# Patient Record
Sex: Female | Born: 1980 | Hispanic: Yes | Marital: Married
Health system: Southern US, Community
[De-identification: ages and names within clinical notes are randomized; demographics above are authoritative.]

## PROBLEM LIST (undated history)

## (undated) DIAGNOSIS — E119 Type 2 diabetes mellitus without complications: Secondary | ICD-10-CM

## (undated) HISTORY — PX: APPENDECTOMY: SHX54

---

## 2004-08-22 ENCOUNTER — Inpatient Hospital Stay: Payer: Self-pay | Admitting: Internal Medicine

## 2004-10-06 ENCOUNTER — Emergency Department: Payer: Self-pay | Admitting: General Practice

## 2006-03-20 ENCOUNTER — Emergency Department: Payer: Self-pay | Admitting: Unknown Physician Specialty

## 2006-11-07 ENCOUNTER — Emergency Department: Payer: Self-pay

## 2006-11-08 ENCOUNTER — Ambulatory Visit: Payer: Self-pay

## 2007-03-21 ENCOUNTER — Emergency Department: Payer: Self-pay | Admitting: Emergency Medicine

## 2008-06-20 ENCOUNTER — Emergency Department: Payer: Self-pay | Admitting: Emergency Medicine

## 2008-08-12 ENCOUNTER — Emergency Department: Payer: Self-pay | Admitting: Emergency Medicine

## 2008-08-13 ENCOUNTER — Ambulatory Visit: Payer: Self-pay | Admitting: Obstetrics & Gynecology

## 2010-12-24 ENCOUNTER — Emergency Department: Payer: Self-pay | Admitting: Emergency Medicine

## 2013-03-18 ENCOUNTER — Emergency Department: Payer: Self-pay | Admitting: Emergency Medicine

## 2013-03-18 LAB — URINALYSIS, COMPLETE
Bacteria: NONE SEEN
Ketone: NEGATIVE
Nitrite: NEGATIVE
Ph: 7 (ref 4.5–8.0)
Protein: 100
RBC,UR: 24 /HPF (ref 0–5)
WBC UR: 191 /HPF (ref 0–5)

## 2016-01-24 ENCOUNTER — Emergency Department
Admission: EM | Admit: 2016-01-24 | Discharge: 2016-01-24 | Disposition: A | Discharge status: Home / Self Care | Payer: Self-pay | Attending: Emergency Medicine | Admitting: Emergency Medicine

## 2016-01-24 ENCOUNTER — Encounter: Payer: Self-pay | Admitting: Medical Oncology

## 2016-01-24 ENCOUNTER — Emergency Department: Payer: Self-pay

## 2016-01-24 DIAGNOSIS — R1031 Right lower quadrant pain: Secondary | ICD-10-CM | POA: Insufficient documentation

## 2016-01-24 DIAGNOSIS — E1165 Type 2 diabetes mellitus with hyperglycemia: Secondary | ICD-10-CM | POA: Insufficient documentation

## 2016-01-24 DIAGNOSIS — R739 Hyperglycemia, unspecified: Secondary | ICD-10-CM

## 2016-01-24 DIAGNOSIS — R109 Unspecified abdominal pain: Secondary | ICD-10-CM

## 2016-01-24 HISTORY — DX: Type 2 diabetes mellitus without complications: E11.9

## 2016-01-24 LAB — CBC
HCT: 46.3 % (ref 35.0–47.0)
HEMOGLOBIN: 15.2 g/dL (ref 12.0–16.0)
MCH: 25.8 pg — AB (ref 26.0–34.0)
MCHC: 32.9 g/dL (ref 32.0–36.0)
MCV: 78.5 fL — AB (ref 80.0–100.0)
Platelets: 275 10*3/uL (ref 150–440)
RBC: 5.9 MIL/uL — AB (ref 3.80–5.20)
RDW: 12.8 % (ref 11.5–14.5)
WBC: 9.7 10*3/uL (ref 3.6–11.0)

## 2016-01-24 LAB — GLUCOSE, CAPILLARY
GLUCOSE-CAPILLARY: 373 mg/dL — AB (ref 65–99)
GLUCOSE-CAPILLARY: 285 mg/dL — AB (ref 65–99)

## 2016-01-24 LAB — COMPREHENSIVE METABOLIC PANEL
ALBUMIN: 4.2 g/dL (ref 3.5–5.0)
ALK PHOS: 82 U/L (ref 38–126)
ALT: 22 U/L (ref 14–54)
ANION GAP: 9 (ref 5–15)
AST: 26 U/L (ref 15–41)
BUN: 9 mg/dL (ref 6–20)
CALCIUM: 9.3 mg/dL (ref 8.9–10.3)
CHLORIDE: 101 mmol/L (ref 101–111)
CO2: 22 mmol/L (ref 22–32)
CREATININE: 0.39 mg/dL — AB (ref 0.44–1.00)
GFR calc non Af Amer: >60 mL/min (ref >60)
GLUCOSE: 361 mg/dL — AB (ref 65–99)
Potassium: 3.9 mmol/L (ref 3.5–5.1)
SODIUM: 132 mmol/L — AB (ref 135–145)
Total Bilirubin: 0.7 mg/dL (ref 0.3–1.2)
Total Protein: 7.8 g/dL (ref 6.5–8.1)

## 2016-01-24 LAB — URINALYSIS COMPLETE WITH MICROSCOPIC (ARMC ONLY)
Bacteria, UA: NONE SEEN
Bilirubin Urine: NEGATIVE
Leukocytes, UA: NEGATIVE
Nitrite: NEGATIVE
PROTEIN: NEGATIVE mg/dL
Specific Gravity, Urine: 1.028 (ref 1.005–1.030)
pH: 6 (ref 5.0–8.0)

## 2016-01-24 LAB — LIPASE, BLOOD: LIPASE: 34 U/L (ref 11–51)

## 2016-01-24 MED ORDER — INSULIN ASPART 100 UNIT/ML ~~LOC~~ SOLN
6.0000 [IU] | Freq: Once | SUBCUTANEOUS | Status: DC
  Filled 2016-01-24: qty 0.06
  Filled 2016-01-24: qty 6

## 2016-01-24 MED ORDER — SODIUM CHLORIDE 0.9 % IV BOLUS (SEPSIS)
1000.0000 mL | Freq: Once | INTRAVENOUS | Status: DC
Start: 2016-01-24 — End: 2016-01-24

## 2016-01-24 MED ORDER — INSULIN ASPART 100 UNIT/ML IV SOLN
6.0000 [IU] | Freq: Once | INTRAVENOUS | Status: AC
  Administered 2016-01-24: 6 [IU] via INTRAVENOUS
  Filled 2016-01-24: qty 0.06

## 2016-01-24 MED ORDER — SODIUM CHLORIDE 0.9 % IV BOLUS (SEPSIS)
1000.0000 mL | Freq: Once | INTRAVENOUS | Status: AC
  Administered 2016-01-24: 1000 mL via INTRAVENOUS

## 2016-01-24 MED ORDER — IBUPROFEN 400 MG PO TABS: 400.0000 mg | ORAL_TABLET | Freq: Four times a day (QID) | ORAL | 0 refills | Status: AC | PRN

## 2016-01-24 MED ORDER — METFORMIN HCL 500 MG PO TABS: 500.0000 mg | ORAL_TABLET | Freq: Two times a day (BID) | ORAL | 0 refills | Status: AC

## 2016-01-24 MED ORDER — INSULIN ASPART 100 UNIT/ML ~~LOC~~ SOLN: 6.0000 [IU] | Freq: Once | SUBCUTANEOUS | Status: DC

## 2016-01-24 MED ORDER — KETOROLAC TROMETHAMINE 30 MG/ML IJ SOLN
15.0000 mg | Freq: Once | INTRAMUSCULAR | Status: AC
  Administered 2016-01-24: 15 mg via INTRAVENOUS
  Filled 2016-01-24: qty 1

## 2016-01-24 NOTE — ED Triage Notes (Signed)
Pt reports she began having rt lower abd pain and pain into her flank Friday. Pt also reports that she has been having vaginal itching and pain with urination also. Pt reports she also has not been taking her insulin x 1 week or checking her blood sugars.

## 2016-01-24 NOTE — Discharge Instructions (Signed)
Please return immediately if condition worsens. Please contact her primary physician or the physician you were given for referral. If you have any specialist physicians involved in her treatment and plan please also contact them. Thank you for using [facility].  Please continue with your follow-up appointment with the health department

## 2016-01-24 NOTE — ED Notes (Signed)
MD and interpreter at bedside.

## 2016-01-24 NOTE — ED Notes (Signed)
Interpreter requested for Assessment of patient.

## 2016-01-24 NOTE — ED Provider Notes (Signed)
Time Seen: Approximately 1531  I have reviewed the triage notes  Chief Complaint: Abdominal Pain; Dysuria; Vaginal Itching; and Flank Pain   History of Present Illness: Tricia Gordon is a 35 y.o. female whose history, review of systems, etc. was taken through a Engineer, structural. She apparently is been having some right lower quadrant abdominal pain that radiates toward her right flank area for the last 4 days. Pain seems to come and go and is caused some burning with urination. She's noticed some urinary frequency and states that she's been off her medication now for the past week for her diabetes. Patient states that she's on insulin but can't identify the dosage or what type of insulin she is taking. We tried to investigate through the pharmacy technician and apparently she hasn't had any insulin in several years. The patient points mainly to the right lower quadrant source of discomfort without loose stool, melena, or hematochezia.   Past Medical History:  Diagnosis Date  . Diabetes mellitus without complication (HCC)     There are no active problems to display for this patient.   Past Surgical History:  Procedure Laterality Date  . APPENDECTOMY      Past Surgical History:  Procedure Laterality Date  . APPENDECTOMY      Current Outpatient Rx  . Order #: [id number] Class: Print  . Order #: [id number] Class: Print    Allergies:  Penicillins  Family History: No family history on file.  Social History: Social History  Substance Use Topics  . Smoking status: Not on file  . Smokeless tobacco: Not on file  . Alcohol use Not on file     Review of Systems:   10 point review of systems was performed and was otherwise negative:  Constitutional: No fever Eyes: No visual disturbances ENT: No sore throat, ear pain Cardiac: No chest pain Respiratory: No shortness of breath, wheezing, or stridor Abdomen: Pain is exclusively in the right lower  quadrant. Endocrine: No weight loss, No night sweats Extremities: No peripheral edema, cyanosis Skin: No rashes, easy bruising. She has asked me to look at the lesion is located on her right shoulder. Neurologic: No focal weakness, trouble with speech or swollowing Urologic: No hematuria Physical Exam: She denies any vaginal discharge or bleeding   ED Triage Vitals  Enc Vitals Group     BP 01/24/16 1445 122/88     Pulse Rate 01/24/16 1445 86     Resp 01/24/16 1445 18     Temp 01/24/16 1445 98.7 F (37.1 C)     Temp Source 01/24/16 1445 Oral     SpO2 01/24/16 1445 97 %     Weight 01/24/16 1446 153 lb (69.4 kg)     Height 01/24/16 1446 5\' 2"  (1.575 m)     Head Circumference --      Peak Flow --      Pain Score 01/24/16 1446 8     Pain Loc --      Pain Edu? --      Excl. in [place]? --     General: Awake , Alert , and Oriented times 3; GCS 15 Head: Normal cephalic , atraumatic Eyes: Pupils equal , round, reactive to light Nose/Throat: No nasal drainage, patent upper airway without erythema or exudate.  Neck: Supple, Full range of motion, No anterior adenopathy or palpable thyroid masses Lungs: Clear to ascultation without wheezes , rhonchi, or rales Heart: Regular rate, regular rhythm without murmurs , gallops , or rubs Abdomen:  Soft, non tender without rebound, guarding , or rigidity; bowel sounds positive and symmetric in all 4 quadrants. No organomegaly .        Extremities: 2 plus symmetric pulses. No edema, clubbing or cyanosis Neurologic: normal ambulation, Motor symmetric without deficits, sensory intact Skin: There is a small approximately centimeter circular raised dark-appearing lesion on the right scapular region. There is no skin induration or erythema. No discharge or drainage. The surface does not appear to be irregular in nature.   Labs:   All laboratory work was reviewed including any pertinent negatives or positives listed below:  Labs Reviewed  COMPREHENSIVE  METABOLIC PANEL - Abnormal; Notable for the following:       Result Value   Sodium 132 (*)    Glucose, Bld 361 (*)    Creatinine, Ser 0.39 (*)    All other components within normal limits  CBC - Abnormal; Notable for the following:    RBC 5.90 (*)    MCV 78.5 (*)    MCH 25.8 (*)    All other components within normal limits  URINALYSIS COMPLETEWITH MICROSCOPIC ([facility] ONLY) - Abnormal; Notable for the following:    Color, Urine STRAW (*)    APPearance CLEAR (*)    Glucose, UA >500 (*)    Ketones, ur TRACE (*)    Hgb urine dipstick 2+ (*)    Squamous Epithelial / LPF 0-5 (*)    All other components within normal limits  GLUCOSE, CAPILLARY - Abnormal; Notable for the following:    Glucose-Capillary 373 (*)    All other components within normal limits  GLUCOSE, CAPILLARY - Abnormal; Notable for the following:    Glucose-Capillary 285 (*)    All other components within normal limits  LIPASE, BLOOD  POC URINE PREG, ED   Patient's blood sugar has decreased and she was given IV fluids along with 6 units of IV insulin. Radiology:  "Ct Renal Stone Study  Result Date: 01/24/2016 CLINICAL DATA:  Right flank and lower abdominal pain for past 3 days. Dysuria. EXAM: CT ABDOMEN AND PELVIS WITHOUT CONTRAST TECHNIQUE: Multidetector CT imaging of the abdomen and pelvis was performed following the standard protocol without IV contrast. COMPARISON:  [facility] Oct 19, 2006 FINDINGS: Lower chest:  No acute findings. Hepatobiliary: No mass visualized on this un-enhanced exam. Diffuse hepatic steatosis again noted. Gallbladder is unremarkable. Pancreas: No mass or inflammatory process identified on this un-enhanced exam. Spleen: Within normal limits in size. Adrenals/Urinary Tract: 1.6 cm low-attenuation left adrenal mass is seen measuring 2 Hounsfield units, consistent with a benign adrenal adenoma. Normal right adrenal gland. No evidence of renal calculi or hydronephrosis. No evidence of ureteral calculi or  dilatation. Unopacified urinary bladder is unremarkable in appearance. Stomach/Bowel: No evidence of obstruction, inflammatory process, or abnormal fluid collections. Right lower quadrant surgical staples from prior appendectomy. Vascular/Lymphatic: No pathologically enlarged lymph nodes. No evidence of abdominal aortic aneurysm. Reproductive: No mass or other significant abnormality. Other: None. Musculoskeletal:  No suspicious bone lesions identified. IMPRESSION: No evidence of urolithiasis, hydronephrosis, or other acute findings. Small benign left adrenal adenoma. Diffuse hepatic steatosis. Electronically Signed   By: Myles RosenthalJohn  Stahl M.D.   On: 01/24/2016 16:57  "     I personally reviewed the radiologic studies     ED Course:  Patient's stay here was uneventful and the patient was given IV fluids and was started on some IV insulin. Patient does not appear to be in diabetic ketoacidosis. Patient denies any left-sided abdominal  pain does not appear to have any surgical causes. Her differential includes renal colic, urinary tract infection, ovarian cyst, etc. She does not appear to have any obvious source for her discomfort at this time. Located on her upper back regions small dark in appearance and does not appear to be skin cancer however I advised her to follow-up with a dermatologist for further close examination  Clinical Course     Assessment: * Hyperglycemia Noncompliance with medication Right lower quadrant abdominal pain unspecified     Plan: * Outpatient " Discharge Medication List as of 01/24/2016  8:08 PM    START taking these medications   Details  ibuprofen (ADVIL,MOTRIN) 400 MG tablet Take 1 tablet (400 mg total) by mouth every 6 (six) hours as needed., Starting Mon 01/24/2016, Print    metFORMIN (GLUCOPHAGE) 500 MG tablet Take 1 tablet (500 mg total) by mouth 2 (two) times daily with a meal., Starting Mon 01/24/2016, Until Tue 01/23/2017, Print      " Patient was  advised to return immediately if condition worsens. Patient was advised to follow up with their primary care physician or other specialized physicians involved in their outpatient care. The patient and/or family member/power of attorney had laboratory results reviewed at the bedside. All questions and concerns were addressed and appropriate discharge instructions were distributed by the nursing staff.             Jennye MoccasinBrian S Signe Tackitt, MD 01/24/16 2017

## 2016-01-24 NOTE — ED Notes (Signed)
POC urine preg NEGATIVE.  MD made aware 

## 2017-12-09 IMAGING — CT CT RENAL STONE PROTOCOL
2 of 4 series · 16 of 46 positions shown, 18 images · non-contrast
Comparison: [facility] October 19, 2006

CLINICAL DATA: Right flank and lower abdominal pain for past 3
days. Dysuria.

EXAM:
CT ABDOMEN AND PELVIS WITHOUT CONTRAST
TECHNIQUE: Multidetector CT imaging of the abdomen and pelvis was performed
following the standard protocol without IV contrast.

[Series 2: axial st · axial · 0.66mm/px · z∈[-316,+174]mm · 13 of 108 slices shown, 15 images]
[im 5/108  soft-tissue]
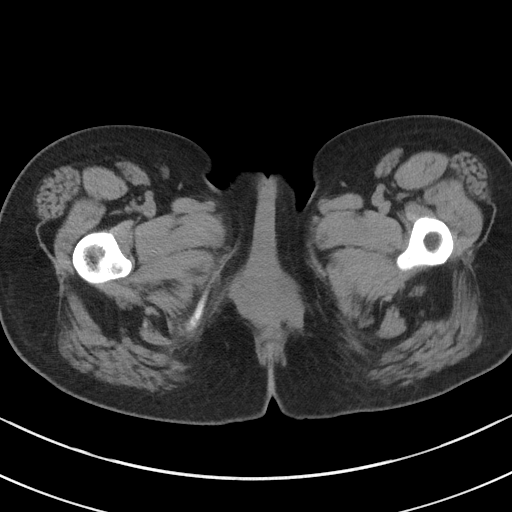
[im 5/108  bone]
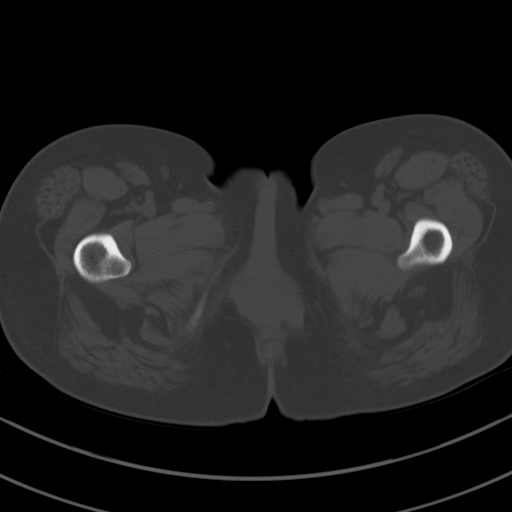
[im 13/108  soft-tissue]
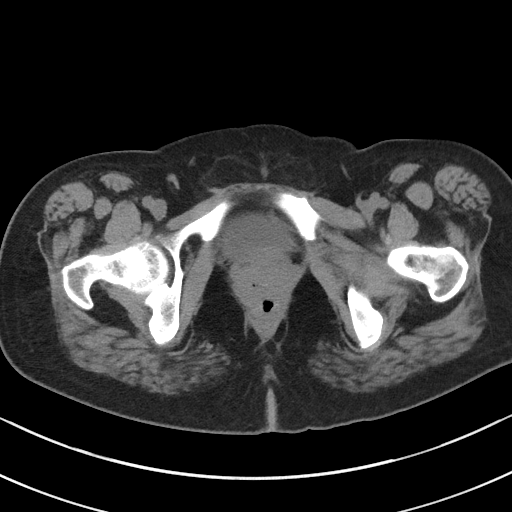
[im 21/108  soft-tissue]
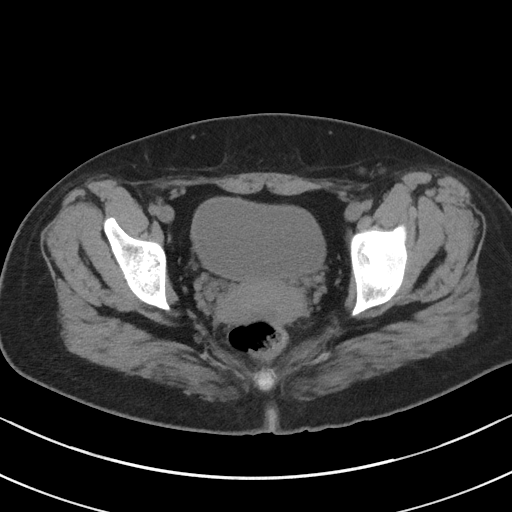
[im 29/108  soft-tissue]
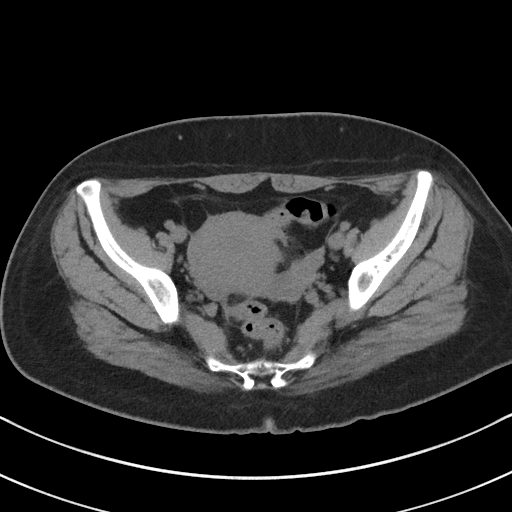
[im 38/108  soft-tissue]
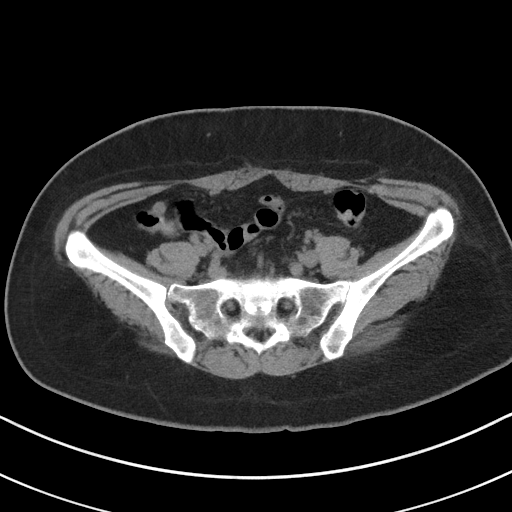
[im 46/108  soft-tissue]
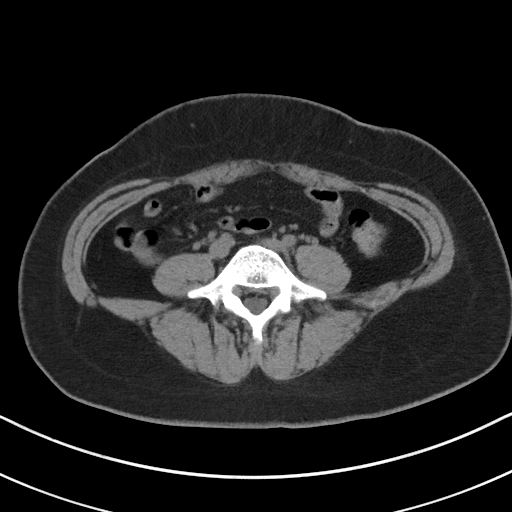
[im 54/108  soft-tissue]
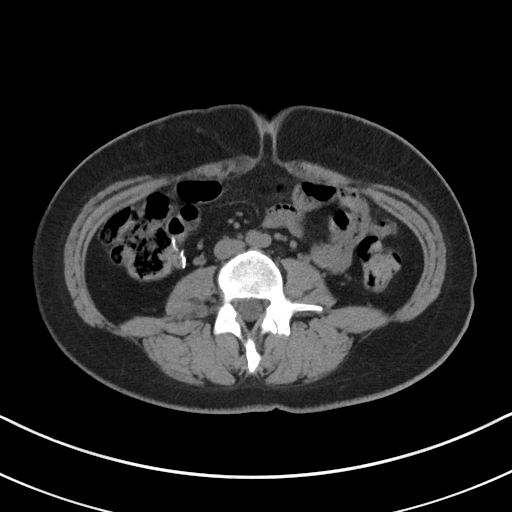
[im 62/108  soft-tissue]
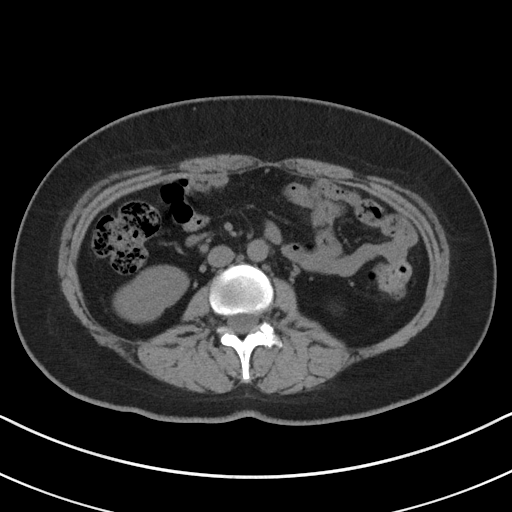
[im 70/108  soft-tissue]
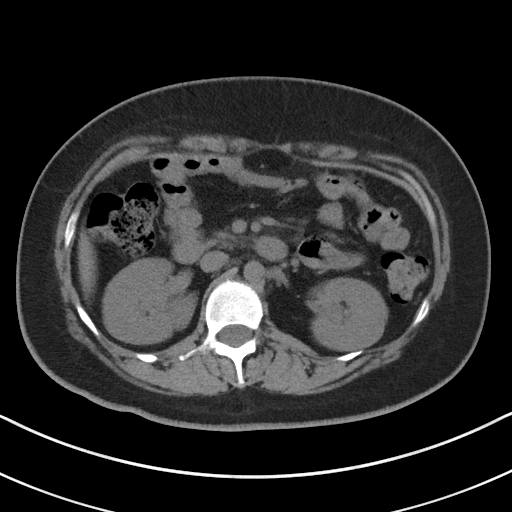
[im 70/108  bone]
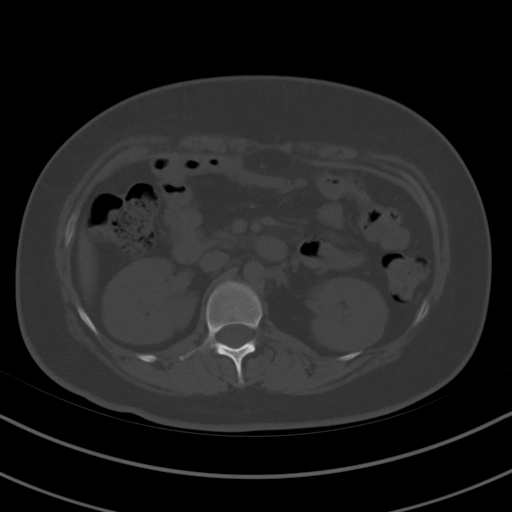
[im 79/108  soft-tissue]
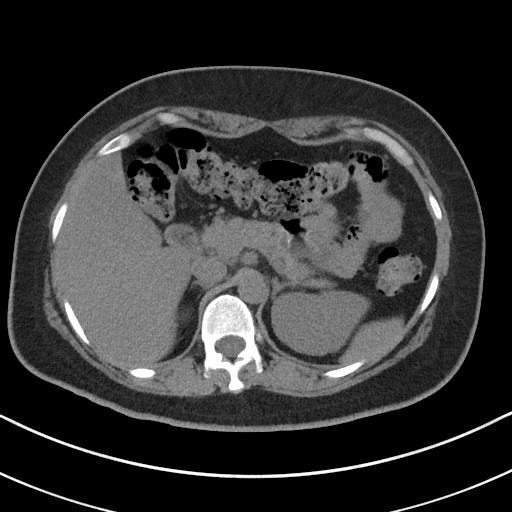
[im 87/108  soft-tissue]
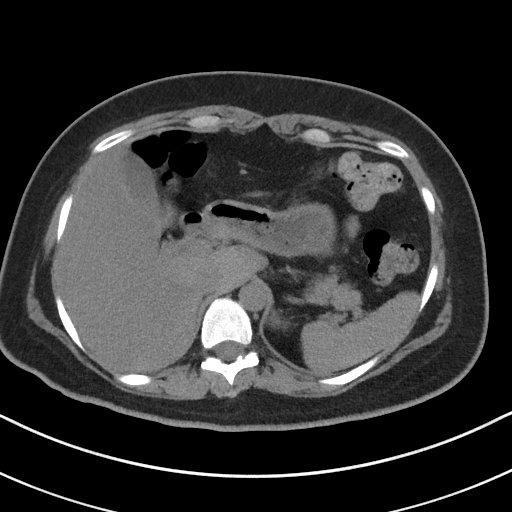
[im 95/108  soft-tissue]
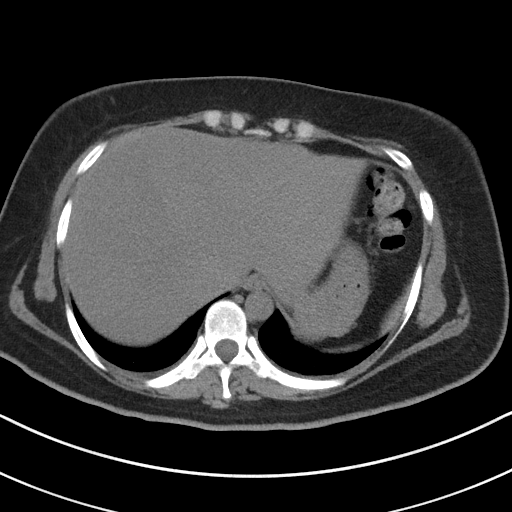
[im 103/108  soft-tissue]
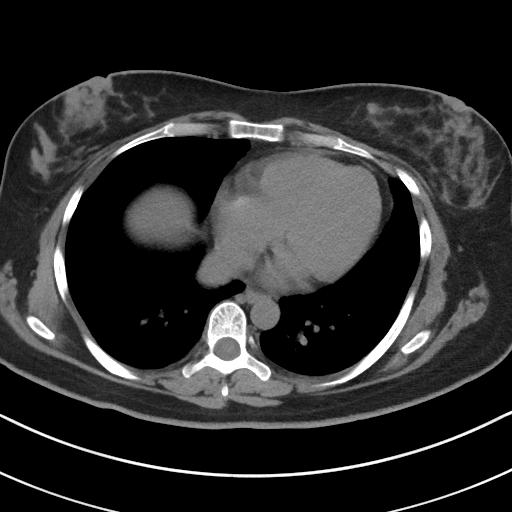

[Series 4: coronal st · coronal · 0.75mm/px · 3 of 78 slices shown]
[im 26/78  soft-tissue]
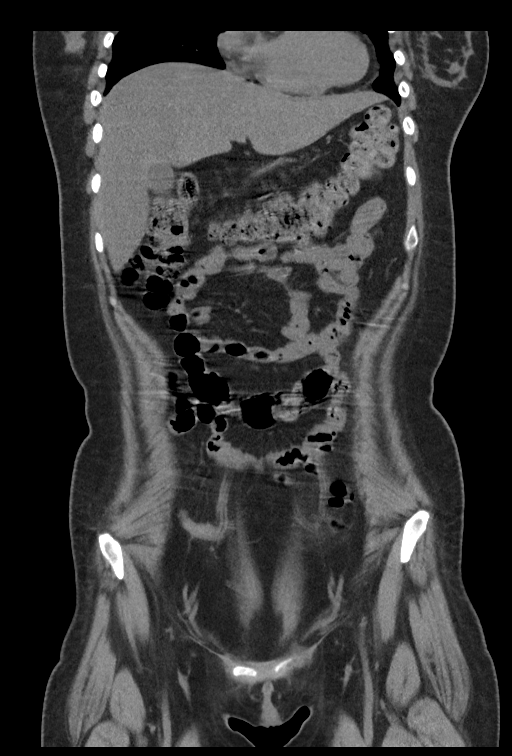
[im 35/78  soft-tissue]
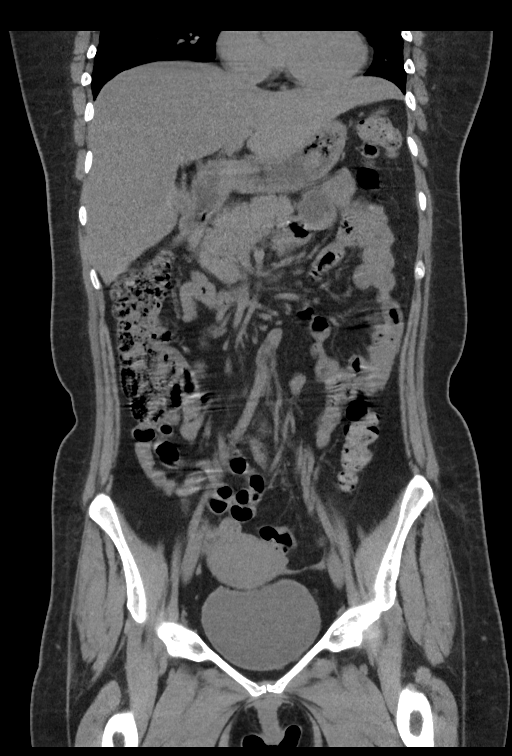
[im 43/78  soft-tissue]
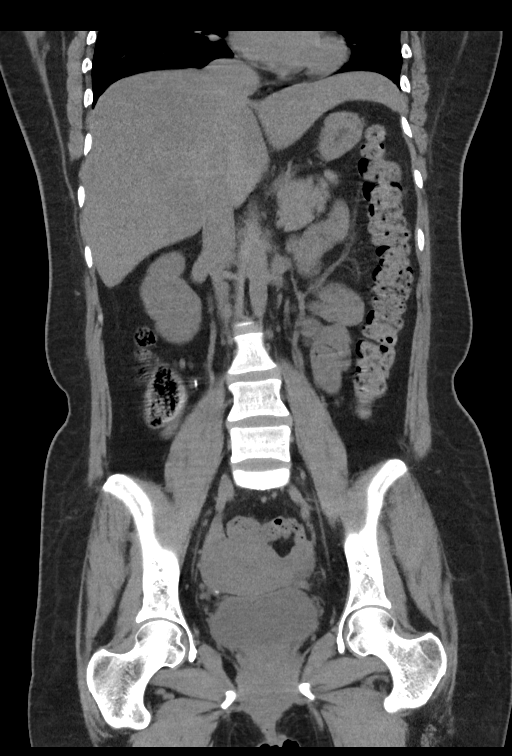

[16 of 46 positions shown; findings below may reference images not displayed]

FINDINGS: Lower chest:  No acute findings.

Hepatobiliary: No mass visualized on this un-enhanced exam. Diffuse
hepatic steatosis again noted. Gallbladder is unremarkable.

Pancreas: No mass or inflammatory process identified on this
un-enhanced exam.

Spleen: Within normal limits in size.

Adrenals/Urinary Tract: 1.6 cm low-attenuation left adrenal mass is
seen measuring 2 Hounsfield units, consistent with a benign adrenal
adenoma. Normal right adrenal gland. No evidence of renal calculi or
hydronephrosis. No evidence of ureteral calculi or dilatation.
Unopacified urinary bladder is unremarkable in appearance.

Stomach/Bowel: No evidence of obstruction, inflammatory process, or
abnormal fluid collections. Right lower quadrant surgical staples
from prior appendectomy.

Vascular/Lymphatic: No pathologically enlarged lymph nodes. No
evidence of abdominal aortic aneurysm.

Reproductive: No mass or other significant abnormality.

Other: None.

Musculoskeletal:  No suspicious bone lesions identified.
IMPRESSION: No evidence of urolithiasis, hydronephrosis, or other acute
findings.

Small benign left adrenal adenoma.

Diffuse hepatic steatosis.

## 2019-06-24 ENCOUNTER — Encounter: Payer: Self-pay | Admitting: Emergency Medicine

## 2019-06-24 ENCOUNTER — Other Ambulatory Visit: Payer: Self-pay

## 2019-06-24 ENCOUNTER — Emergency Department
Admission: EM | Admit: 2019-06-24 | Discharge: 2019-06-24 | Disposition: A | Discharge status: Home / Self Care | Payer: Self-pay | Attending: Emergency Medicine | Admitting: Emergency Medicine

## 2019-06-24 ENCOUNTER — Emergency Department: Payer: Self-pay

## 2019-06-24 DIAGNOSIS — E119 Type 2 diabetes mellitus without complications: Secondary | ICD-10-CM | POA: Insufficient documentation

## 2019-06-24 DIAGNOSIS — Y9389 Activity, other specified: Secondary | ICD-10-CM | POA: Insufficient documentation

## 2019-06-24 DIAGNOSIS — X501XXA Overexertion from prolonged static or awkward postures, initial encounter: Secondary | ICD-10-CM | POA: Insufficient documentation

## 2019-06-24 DIAGNOSIS — Y999 Unspecified external cause status: Secondary | ICD-10-CM | POA: Insufficient documentation

## 2019-06-24 DIAGNOSIS — S92351A Displaced fracture of fifth metatarsal bone, right foot, initial encounter for closed fracture: Secondary | ICD-10-CM | POA: Insufficient documentation

## 2019-06-24 DIAGNOSIS — Y92019 Unspecified place in single-family (private) house as the place of occurrence of the external cause: Secondary | ICD-10-CM | POA: Insufficient documentation

## 2019-06-24 DIAGNOSIS — Z7984 Long term (current) use of oral hypoglycemic drugs: Secondary | ICD-10-CM | POA: Insufficient documentation

## 2019-06-24 MED ORDER — TRAMADOL HCL 50 MG PO TABS: 50.0000 mg | ORAL_TABLET | Freq: Four times a day (QID) | ORAL | 0 refills | Status: AC | PRN

## 2019-06-24 NOTE — ED Provider Notes (Signed)
[facility] Emergency Department Provider Note   ____________________________________________   None    (approximate)  I have reviewed the triage vital signs and the nursing notes.   HISTORY  Chief Complaint Foot Injury Per Spanish interpreter  HPI Tricia Gordon is a 39 y.o. female resents to the ED with complaint of right foot pain.  Patient states that she was walking on the stairs inside her house last evening when she twisted her ankle and injured her right foot.  Patient states that she did not fall, there was no head injury or loss of consciousness.  She states that this morning she was unable to bear weight without extreme pain.  There is also more bruising and swelling today.  Rates her pain as 5 out of 10.      Past Medical History:  Diagnosis Date  . Diabetes mellitus without complication ([place])     There are no problems to display for this patient.   Past Surgical History:  Procedure Laterality Date  . APPENDECTOMY      Prior to Admission medications   Medication Sig Start Date End Date Taking? Authorizing Provider  ibuprofen (ADVIL,MOTRIN) 400 MG tablet Take 1 tablet (400 mg total) by mouth every 6 (six) hours as needed. 01/24/16   Daymon Larsen, MD  metFORMIN (GLUCOPHAGE) 500 MG tablet Take 1 tablet (500 mg total) by mouth 2 (two) times daily with a meal. 01/24/16 01/23/17  Daymon Larsen, MD  traMADol (ULTRAM) 50 MG tablet Take 1 tablet (50 mg total) by mouth every 6 (six) hours as needed. 06/24/19   Johnn Hai, PA-C    Allergies Penicillins  No family history on file.  Social History Social History   Tobacco Use  . Smoking status: Not on file  Substance Use Topics  . Alcohol use: Not on file  . Drug use: Not on file    Review of Systems Constitutional: No fever/chills Eyes: No visual changes. ENT: No injury Cardiovascular: Denies chest pain. Respiratory: Denies shortness of  breath. Gastrointestinal:   No nausea, no vomiting.   Genitourinary: Negative for dysuria. Musculoskeletal: Positive for right foot pain. Skin: Positive for ecchymosis right foot. Neurological: Negative for headaches, focal weakness or numbness. ____________________________________________   PHYSICAL EXAM:  VITAL SIGNS: ED Triage Vitals  Enc Vitals Group     BP 06/24/19 0902 122/84     Pulse Rate 06/24/19 0902 89     Resp 06/24/19 0902 20     Temp 06/24/19 0902 98.2 F (36.8 C)     Temp Source 06/24/19 0902 Oral     SpO2 06/24/19 0902 98 %     Weight 06/24/19 0903 153 lb (69.4 kg)     Height 06/24/19 0903 5\' 4"  (1.626 m)     Head Circumference --      Peak Flow --      Pain Score 06/24/19 0902 5     Pain Loc --      Pain Edu? --      Excl. in [place]? --    Constitutional: Alert and oriented. Well appearing and in no acute distress. Eyes: Conjunctivae are normal.  Head: Atraumatic. Nose: No congestion/rhinnorhea. Mouth/Throat: Mucous membranes are moist.  Oropharynx non-erythematous. Neck: No stridor.   Cardiovascular: Normal rate, regular rhythm. Grossly normal heart sounds.  Good peripheral circulation. Respiratory: Normal respiratory effort.  No retractions. Lungs CTAB. Musculoskeletal: Examination of the right foot there is marked soft tissue edema and ecchymosis on  the dorsal aspect especially over the fourth and fifth metatarsal.  Area is markedly tender to palpation.  Patient motor sensory function is intact distal to the injury.  Skin is intact.  Range of motion is restricted secondary to pain. Neurologic:  Normal speech and language. No gross focal neurologic deficits are appreciated.  Skin:  Skin is warm, dry and intact. No rash noted. Psychiatric: Mood and affect are normal. Speech and behavior are normal.  ____________________________________________   LABS (all labs ordered are listed, but only abnormal results are displayed)  Labs Reviewed - No data to  display RADIOLOGY   Official radiology report(s): DG Foot Complete Right  Result Date: 06/24/2019 CLINICAL DATA:  Ankle injury, foot pain EXAM: RIGHT FOOT COMPLETE - 3+ VIEW COMPARISON:  None FINDINGS: Transverse fracture at the base of the right fifth metatarsal with approximately 2.6 mm of distraction of fracture fragments. Soft tissue swelling along the lateral portion of the right foot, associated with above finding. No additional bony abnormality. IMPRESSION: Mildly displaced fracture at the base of the right fifth metatarsal. Based on location fracture would be at high risk for nonunion. Electronically Signed   By: Donzetta Kohut M.D.   On: 06/24/2019 09:29    ____________________________________________   PROCEDURES  Procedure(s) performed (including Critical Care):  Procedures   ____________________________________________   INITIAL IMPRESSION / ASSESSMENT AND PLAN / ED COURSE  As part of my medical decision making, I reviewed the following data within the electronic MEDICAL RECORD NUMBER Notes from prior ED visits and [place] Controlled Substance Database  39 year old female presents to the ED with complaint of right foot pain after she twisted her ankle on the steps within her house last evening.  This morning she has increased pain and swelling.  Physical exam is suspicious for fracture and x-ray confirms that she has a displaced fracture of the fifth metatarsal.  Patient is diabetic and was encouraged to make an appointment with the podiatrist to make sure this is healing well.  She was placed in an Ace wrap and a postop shoe.  She was given crutches.  Tramadol every 6 hours if needed for pain and instructions to ice and elevate was given to her verbally by the interpreter.  ____________________________________________   FINAL CLINICAL IMPRESSION(S) / ED DIAGNOSES  Final diagnoses:  Displaced fracture of fifth metatarsal bone, right foot, initial encounter for closed fracture      ED Discharge Orders         Ordered    traMADol (ULTRAM) 50 MG tablet  Every 6 hours PRN     06/24/19 1100           Note:  This document was prepared using Dragon voice recognition software and may include unintentional dictation errors.    Tommi Rumps, PA-C 06/24/19 1400    Chesley Noon, MD 06/25/19 [id number]

## 2019-06-24 NOTE — ED Triage Notes (Signed)
Per interpreter, pt was walking on the stairs and twisted her right ankle. Pt now with pain to right foot. Bruising and swelling noted.

## 2019-06-24 NOTE — Discharge Instructions (Addendum)
Call make an appointment with Dr. Ether Griffins and his information is listed on your discharge papers.  Ice and elevate to reduce swelling.  Wear postop shoe and use crutches for added support.  You may take ibuprofen as needed for inflammation.  A prescription for pain medication was sent to your pharmacy.

## 2019-06-24 NOTE — ED Notes (Signed)
Ace wrap and post op shoe applied to right foot with no issue. Pt given crutches and instructed on how to use them.

## 2021-05-09 IMAGING — CR DG FOOT COMPLETE 3+V*R*
1 series · 3 of 3 positions shown · non-contrast
Comparison: None

CLINICAL DATA: Ankle injury, foot pain

EXAM:
RIGHT FOOT COMPLETE - 3+ VIEW

[Series 1: dg foot complete right · 0.14mm/px · 3 of 3 slices shown]
[im 1/3]
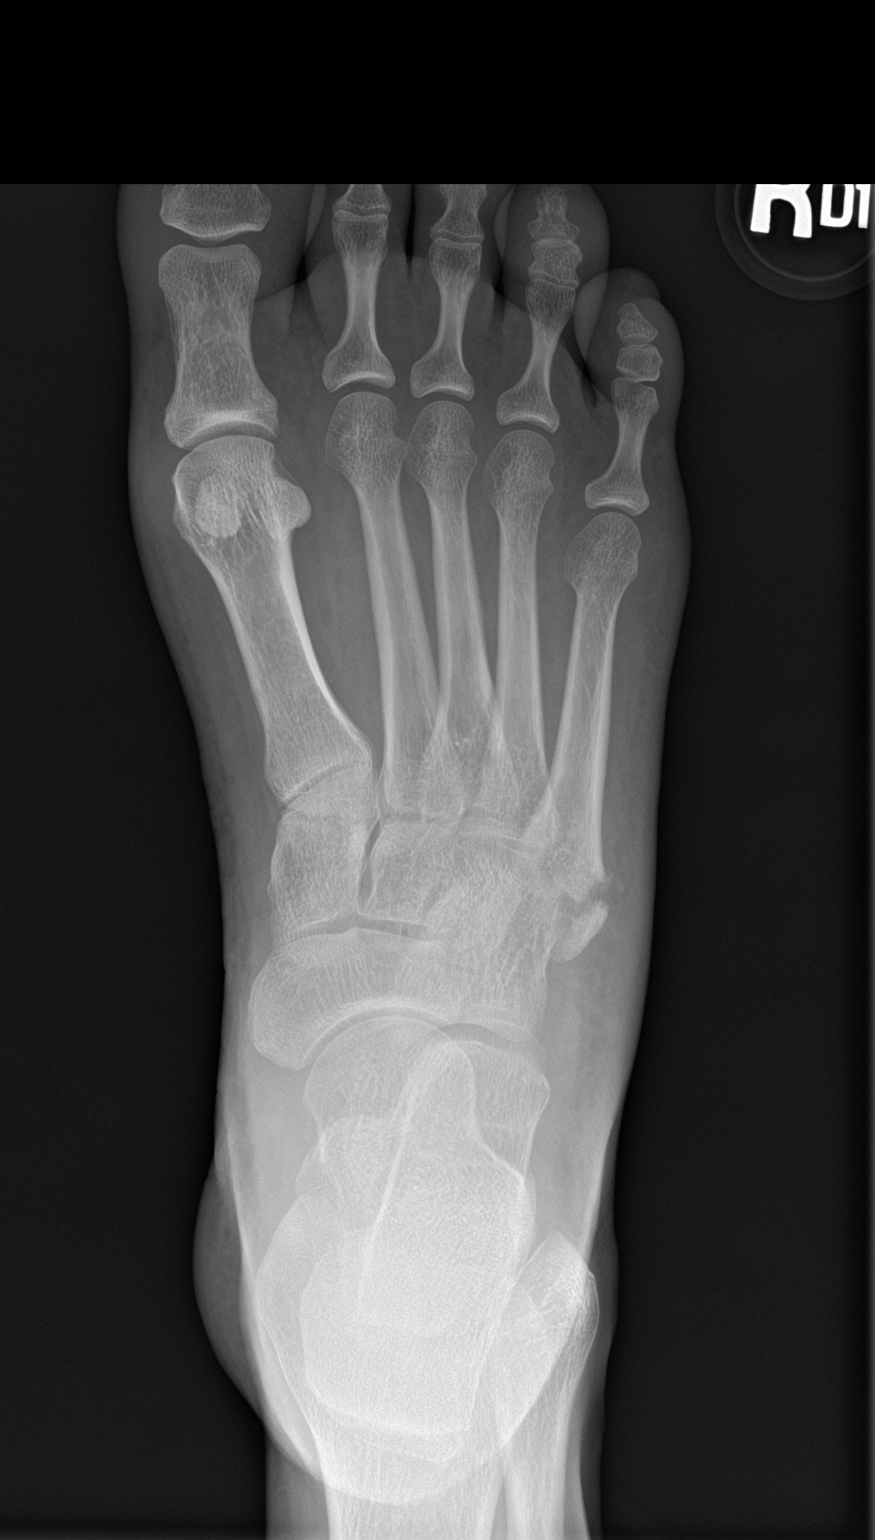
[im 2/3]
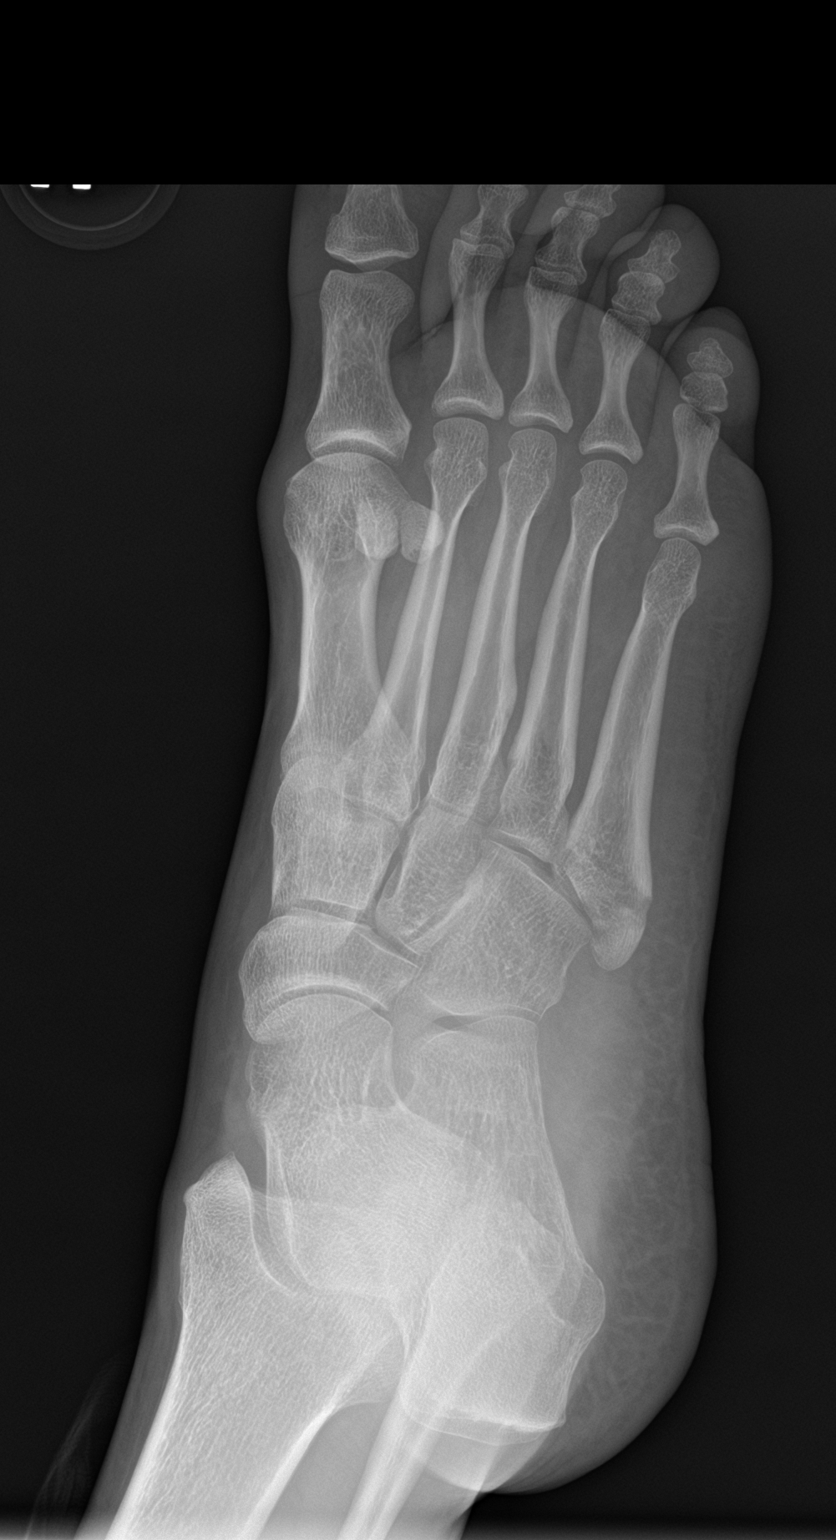
[im 3/3]
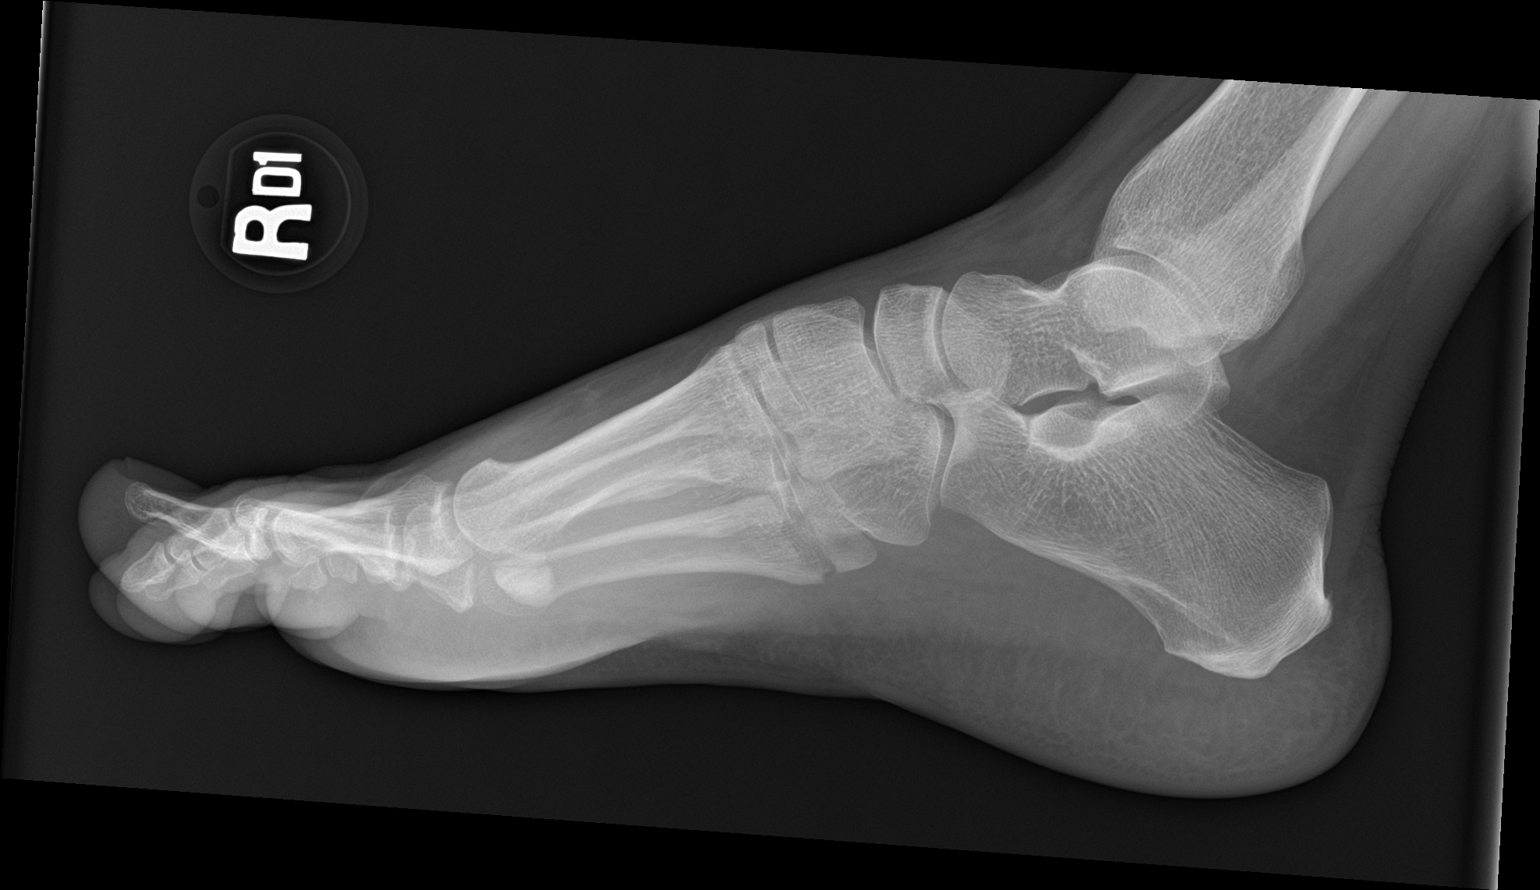

[3 of 3 positions shown; findings below may reference images not displayed]

FINDINGS: Transverse fracture at the base of the right fifth metatarsal with
approximately 2.6 mm of distraction of fracture fragments.

Soft tissue swelling along the lateral portion of the right foot,
associated with above finding. No additional bony abnormality.
IMPRESSION: Mildly displaced fracture at the base of the right fifth metatarsal.
Based on location fracture would be at high risk for nonunion.

## 2022-01-27 ENCOUNTER — Emergency Department: Payer: Self-pay

## 2022-01-27 ENCOUNTER — Other Ambulatory Visit: Payer: Self-pay

## 2022-01-27 ENCOUNTER — Emergency Department
Admission: EM | Admit: 2022-01-27 | Discharge: 2022-01-27 | Disposition: A | Discharge status: Home / Self Care | Payer: Self-pay | Attending: Emergency Medicine | Admitting: Emergency Medicine

## 2022-01-27 DIAGNOSIS — M5431 Sciatica, right side: Secondary | ICD-10-CM

## 2022-01-27 DIAGNOSIS — R739 Hyperglycemia, unspecified: Secondary | ICD-10-CM

## 2022-01-27 DIAGNOSIS — R109 Unspecified abdominal pain: Secondary | ICD-10-CM

## 2022-01-27 DIAGNOSIS — R1031 Right lower quadrant pain: Secondary | ICD-10-CM

## 2022-01-27 DIAGNOSIS — E1065 Type 1 diabetes mellitus with hyperglycemia: Secondary | ICD-10-CM | POA: Insufficient documentation

## 2022-01-27 DIAGNOSIS — Z7984 Long term (current) use of oral hypoglycemic drugs: Secondary | ICD-10-CM | POA: Insufficient documentation

## 2022-01-27 DIAGNOSIS — R300 Dysuria: Secondary | ICD-10-CM | POA: Insufficient documentation

## 2022-01-27 LAB — BLOOD GAS, VENOUS
Acid-Base Excess: 1.2 mmol/L (ref 0.0–2.0)
Bicarbonate: 26 mmol/L (ref 20.0–28.0)
O2 Saturation: 93.9 %
Patient temperature: 37
pCO2, Ven: 41 mmHg — ABNORMAL LOW (ref 44–60)
pH, Ven: 7.41 (ref 7.25–7.43)
pO2, Ven: 67 mmHg — ABNORMAL HIGH (ref 32–45)

## 2022-01-27 LAB — URINALYSIS, ROUTINE W REFLEX MICROSCOPIC
Bacteria, UA: NONE SEEN
Bilirubin Urine: NEGATIVE
Glucose, UA: >=500 mg/dL — AB
Hgb urine dipstick: NEGATIVE
Ketones, ur: NEGATIVE mg/dL
Leukocytes,Ua: NEGATIVE
Nitrite: NEGATIVE
Protein, ur: NEGATIVE mg/dL
Specific Gravity, Urine: 1.027 (ref 1.005–1.030)
pH: 6 (ref 5.0–8.0)

## 2022-01-27 LAB — BASIC METABOLIC PANEL
Anion gap: 10 (ref 5–15)
BUN: 11 mg/dL (ref 6–20)
CO2: 22 mmol/L (ref 22–32)
Calcium: 9.2 mg/dL (ref 8.9–10.3)
Chloride: 102 mmol/L (ref 98–111)
Creatinine, Ser: 0.54 mg/dL (ref 0.44–1.00)
GFR, Estimated: >60 mL/min (ref >60)
Glucose, Bld: 550 mg/dL (ref 70–99)
Potassium: 3.9 mmol/L (ref 3.5–5.1)
Sodium: 134 mmol/L — ABNORMAL LOW (ref 135–145)

## 2022-01-27 LAB — CBC WITH DIFFERENTIAL/PLATELET
Abs Immature Granulocytes: 0.02 10*3/uL (ref 0.00–0.07)
Basophils Absolute: 0 10*3/uL (ref 0.0–0.1)
Basophils Relative: 0 %
Eosinophils Absolute: 0.2 10*3/uL (ref 0.0–0.5)
Eosinophils Relative: 2 %
HCT: 41.1 % (ref 36.0–46.0)
Hemoglobin: 13.1 g/dL (ref 12.0–15.0)
Immature Granulocytes: 0 %
Lymphocytes Relative: 36 %
Lymphs Abs: 3.3 10*3/uL (ref 0.7–4.0)
MCH: 25.3 pg — ABNORMAL LOW (ref 26.0–34.0)
MCHC: 31.9 g/dL (ref 30.0–36.0)
MCV: 79.5 fL — ABNORMAL LOW (ref 80.0–100.0)
Monocytes Absolute: 0.5 10*3/uL (ref 0.1–1.0)
Monocytes Relative: 6 %
Neutro Abs: 5.1 10*3/uL (ref 1.7–7.7)
Neutrophils Relative %: 56 %
Platelets: 379 10*3/uL (ref 150–400)
RBC: 5.17 MIL/uL — ABNORMAL HIGH (ref 3.87–5.11)
RDW: 12.7 % (ref 11.5–15.5)
WBC: 9.1 10*3/uL (ref 4.0–10.5)
nRBC: 0 % (ref 0.0–0.2)

## 2022-01-27 LAB — PREGNANCY, URINE: Preg Test, Ur: NEGATIVE

## 2022-01-27 LAB — HEPATIC FUNCTION PANEL
ALT: 12 U/L (ref 0–44)
AST: 17 U/L (ref 15–41)
Albumin: 3.8 g/dL (ref 3.5–5.0)
Alkaline Phosphatase: 94 U/L (ref 38–126)
Bilirubin, Direct: <0.1 mg/dL (ref 0.0–0.2)
Total Bilirubin: 0.5 mg/dL (ref 0.3–1.2)
Total Protein: 7.2 g/dL (ref 6.5–8.1)

## 2022-01-27 LAB — POC URINE PREG, ED

## 2022-01-27 LAB — LIPASE, BLOOD: Lipase: 59 U/L — ABNORMAL HIGH (ref 11–51)

## 2022-01-27 LAB — CBG MONITORING, ED
Glucose-Capillary: 557 mg/dL (ref 70–99)
Glucose-Capillary: 335 mg/dL — ABNORMAL HIGH (ref 70–99)
Glucose-Capillary: 308 mg/dL — ABNORMAL HIGH (ref 70–99)
Glucose-Capillary: 230 mg/dL — ABNORMAL HIGH (ref 70–99)

## 2022-01-27 MED ORDER — SODIUM CHLORIDE 0.9 % IV BOLUS
1000.0000 mL | Freq: Once | INTRAVENOUS | Status: AC
  Administered 2022-01-27: 1000 mL via INTRAVENOUS

## 2022-01-27 MED ORDER — INSULIN ASPART 100 UNIT/ML IJ SOLN
5.0000 [IU] | Freq: Once | INTRAMUSCULAR | Status: AC
  Administered 2022-01-27: 5 [IU] via INTRAVENOUS
  Filled 2022-01-27: qty 1

## 2022-01-27 MED ORDER — LACTATED RINGERS IV BOLUS
1000.0000 mL | Freq: Once | INTRAVENOUS | Status: AC
  Administered 2022-01-27: 1000 mL via INTRAVENOUS

## 2022-01-27 MED ORDER — LIRAGLUTIDE 18 MG/3ML ~~LOC~~ SOPN
0.6000 mg | PEN_INJECTOR | Freq: Every day | SUBCUTANEOUS | 0 refills | Status: AC
  Filled 2022-01-27: qty 3, 30d supply, fill #0

## 2022-01-27 NOTE — ED Notes (Signed)
Pt. States she has right sided facial numbness/tingling. Pt. States right hand/arm feels "ticklish", and her right foot hurts. Dr. Darnelle Catalan at bedside.

## 2022-01-27 NOTE — ED Notes (Signed)
Date and time results received: 01/27/22 1029 (use smartphrase ".now" to insert current time)  Test: GLUCOSE (drawn at 0719) Critical Value: 500  Name of Provider Notified: Malinda  Orders Received? Or Actions Taken?: already treated.

## 2022-01-27 NOTE — ED Notes (Signed)
Interpreter for initial assessment RN and MD. [phone]

## 2022-01-27 NOTE — ED Notes (Signed)
Pt. Still in MRI, will medicate when she returns.

## 2022-01-27 NOTE — ED Triage Notes (Addendum)
Pt presents to ED with c/o of R sided flank pain, pt states HX of kidney stones. Pt does also mention L sided flank pain as well but not much as the right. Pt denies dysuria, fevers or chills.  Pt does also endorse hyperglycemia this morning of 480 at home, pt gave herself 38 units of novolog. Pt is A&Ox4 at this time.   CBG 557 here  Mobile interpreter used during triage

## 2022-01-27 NOTE — ED Notes (Signed)
Pt. Has right flank tenderness to palpation.

## 2022-01-27 NOTE — ED Provider Notes (Signed)
[facility] Provider Note    Event Date/Time   First MD Initiated Contact with Patient 01/27/22 0720     (approximate)   History   Flank Pain (Right)   HPI  [place] Milana Huntsman is a 41 y.o. female who reports several days of fever and right flank pain.  Last night she also had left flank pain.  She reports she is out of her Victoza and cannot keep her sugars below 400.  She says she is a type I diabetic but also takes metformin.  Patient reports numbness and tingling in the right side of the face starting last night.  She also has pain down the back of the left leg that is worse if I do straight leg raise.  She has some pain in her low back as well.  She says last night she developed dysuria.      Physical Exam   Triage Vital Signs: ED Triage Vitals [01/27/22 0708]  Enc Vitals Group     BP 120/81     Pulse Rate 93     Resp 17     Temp 98.4 F (36.9 C)     Temp Source Oral     SpO2 97 %     Weight      Height      Head Circumference      Peak Flow      Pain Score 6     Pain Loc      Pain Edu?      Excl. in GC?     Most recent vital signs: Vitals:   01/27/22 1145 01/27/22 1157  BP:  109/72  Pulse: 81 85  Resp:  16  Temp:  98 F (36.7 C)  SpO2: 99% 99%    General: Awake, no distress.  CV:  Good peripheral perfusion.  Heart regular rate and rhythm no audible murmurs Resp:  Normal effort.  Lungs are clear Abd:  No distention.  Soft but tender on the right side more in the right lower quadrant in the right upper quadrant Back: Right CVA tenderness no real low back tenderness to percussion Neuro cranial nerves II through XII are intact although visual fields were not checked cerebellar finger-nose and rapid alternating movements in the hands are normal heel-to-shin is normal all bilaterally patient reports pain radiating down the back of her right leg when she does this heel-to-shin.  Straight leg raise is positive on the right side 2.   There is no motor weakness.   ED Results / Procedures / Treatments   Labs (all labs ordered are listed, but only abnormal results are displayed) Labs Reviewed  BASIC METABOLIC PANEL - Abnormal; Notable for the following components:      Result Value   Sodium 134 (*)    Glucose, Bld 550 (*)    All other components within normal limits  URINALYSIS, ROUTINE W REFLEX MICROSCOPIC - Abnormal; Notable for the following components:   Color, Urine COLORLESS (*)    APPearance CLEAR (*)    Glucose, UA >=500 (*)    All other components within normal limits  LIPASE, BLOOD - Abnormal; Notable for the following components:   Lipase 59 (*)    All other components within normal limits  BLOOD GAS, VENOUS - Abnormal; Notable for the following components:   pCO2, Ven 41 (*)    pO2, Ven 67 (*)    All other components within normal limits  CBC WITH DIFFERENTIAL/PLATELET -  Abnormal; Notable for the following components:   RBC 5.17 (*)    MCV 79.5 (*)    MCH 25.3 (*)    All other components within normal limits  CBG MONITORING, ED - Abnormal; Notable for the following components:   Glucose-Capillary 557 (*)    All other components within normal limits  CBG MONITORING, ED - Abnormal; Notable for the following components:   Glucose-Capillary 335 (*)    All other components within normal limits  CBG MONITORING, ED - Abnormal; Notable for the following components:   Glucose-Capillary 308 (*)    All other components within normal limits  CBG MONITORING, ED - Abnormal; Notable for the following components:   Glucose-Capillary 230 (*)    All other components within normal limits  URINE CULTURE  HEPATIC FUNCTION PANEL  CBC  PREGNANCY, URINE  POC URINE PREG, ED  CBG MONITORING, ED     EKG EKG read interpreted by me shows normal sinus rhythm rate of 85 normal axis no acute ST-T wave changes patient with low amplitude complexes especially in the chest leads    RADIOLOGY  CT read by radiology  reviewed by me shows no acute pathology.  Patient has had an appendectomy.  No sign of any kidney stones or inflammation. MRI brain is negative  PROCEDURES:  Critical Care performed:  Procedures   MEDICATIONS ORDERED IN ED: Medications  sodium chloride 0.9 % bolus 1,000 mL (0 mLs Intravenous Stopped 01/27/22 0930)  insulin aspart (novoLOG) injection 5 Units (5 Units Intravenous Given 01/27/22 1106)  lactated ringers bolus 1,000 mL (0 mLs Intravenous Stopped 01/27/22 1151)     IMPRESSION / MDM / ASSESSMENT AND PLAN / ED COURSE  I reviewed the triage vital signs and the nursing notes. Patient discussed briefly with neurology.  MRI is negative patient's symptoms are much better after her fluids.  There is no sign of any intra abdominal infection there is no sign of any kidney infection or kidney stones the urine is clear.  Sugar comes down nicely.  Patient feels up to going home.  Differential diagnosis includes, but is not limited to, patient could have had a pyelonephritis or a kidney stone or appendicitis or diverticulitis or colitis or a mini stroke or any 1 of a number of other things but everything seems to have been due to her hyperglycemia.  This was controlled with mostly fluids and some insulin.  Patient did well we will let her go home.  Patient's presentation is most consistent with exacerbation of chronic illness.  The patient is on the cardiac monitor to evaluate for evidence of arrhythmia and/or significant heart rate changes.  None were seen   FINAL CLINICAL IMPRESSION(S) / ED DIAGNOSES   Final diagnoses:  Right lower quadrant abdominal pain  Right flank pain  Hyperglycemia  Sciatica of right side     Rx / DC Orders   ED Discharge Orders          Ordered    liraglutide (VICTOZA) 18 MG/3ML SOPN  Daily        01/27/22 1337             Note:  This document was prepared using Dragon voice recognition software and may include unintentional dictation errors.    Arnaldo Natal, MD 01/27/22 1728

## 2022-01-27 NOTE — TOC Initial Note (Signed)
Transition of Care [facility]) - Initial/Assessment Note    Patient Details  Name: Tricia Gordon MRN: [id number] Date of Birth: 19-Jul-1980  Transition of Care [facility]) CM/SW Contact:    Allayne Butcher, RN Phone Number: 01/27/2022, 2:10 PM  Clinical Narrative:                 Patient is out of her Victoza, she normally gets her prescriptions from [facility] but their pharmacy closes Friday at 12.  Patient does not have insurance.  Medication Management at the [facility] outpatient pharmacy called to see if they have Victoza in stock.  They do have some samples.  Will have MD send script to the [facility] pharmacy and patient can pick up once discharged from the emergency room.         Patient Goals and CMS Choice        Expected Discharge Plan and Services                                                Prior Living Arrangements/Services                       Activities of Daily Living      Permission Sought/Granted                  Emotional Assessment              Admission diagnosis:  Kidney Pain, High Sugar There are no problems to display for this patient.  PCP:  [facility], [facility] Pharmacy:   [facility] - [place], [place] - [address] [place] [address] Phone: [phone] Fax: [phone]  [facility] [address]), [place] - [address] [place]) [place] [address] Phone: [phone] Fax: [phone]  [facility] [address] [place] [address] Phone: [phone] Fax: [phone]     Social Determinants of Health (SDOH) Interventions    Readmission Risk Interventions     No data to display

## 2022-01-27 NOTE — ED Notes (Signed)
URINE POC PREG TEST: NEGATIVE

## 2022-01-27 NOTE — Discharge Instructions (Addendum)
Please return for any worsening pain or if you get a fever or any other problems.  Please return if your sugar gets over 350.  Continue your insulin and your other medications.  I have refilled your Victoza 0.6 mL once a day.  Please follow-up with your clinic as soon as you can.  Take the handwritten Victoza prescription to the [facility] outpatient pharmacy

## 2022-01-29 LAB — URINE CULTURE: Culture: >=100000 — AB
